# Patient Record
Sex: Female | Born: 1975 | Race: White | Hispanic: No | Marital: Single | Smoking: Former smoker
Health system: Southern US, Community
[De-identification: ages and names within clinical notes are randomized; demographics above are authoritative.]

---

## 2008-08-07 ENCOUNTER — Emergency Department (HOSPITAL_COMMUNITY): Admission: EM | Admit: 2008-08-07 | Discharge: 2008-08-07 | Payer: Self-pay | Admitting: Emergency Medicine

## 2009-08-28 ENCOUNTER — Emergency Department (HOSPITAL_COMMUNITY): Admission: EM | Admit: 2009-08-28 | Discharge: 2009-08-28 | Payer: Self-pay | Admitting: Emergency Medicine

## 2010-10-09 LAB — URINALYSIS, ROUTINE W REFLEX MICROSCOPIC
Bilirubin Urine: NEGATIVE
Ketones, ur: NEGATIVE mg/dL
Leukocytes, UA: NEGATIVE
Nitrite: NEGATIVE
Protein, ur: NEGATIVE mg/dL
Urobilinogen, UA: 0.2 mg/dL (ref 0.0–1.0)

## 2010-10-09 LAB — DIFFERENTIAL
Basophils Absolute: 0 10*3/uL (ref 0.0–0.1)
Basophils Relative: 0 % (ref 0–1)
Lymphocytes Relative: 38 % (ref 12–46)
Monocytes Relative: 4 % (ref 3–12)
Neutro Abs: 6.1 10*3/uL (ref 1.7–7.7)
Neutrophils Relative %: 55 % (ref 43–77)

## 2010-10-09 LAB — COMPREHENSIVE METABOLIC PANEL
Alkaline Phosphatase: 81 U/L (ref 39–117)
BUN: 10 mg/dL (ref 6–23)
Creatinine, Ser: 0.8 mg/dL (ref 0.4–1.2)
Glucose, Bld: 93 mg/dL (ref 70–99)
Potassium: 3.9 mEq/L (ref 3.5–5.1)
Total Protein: 7.5 g/dL (ref 6.0–8.3)

## 2010-10-09 LAB — CBC
HCT: 37.8 % (ref 36.0–46.0)
Hemoglobin: 12.8 g/dL (ref 12.0–15.0)
MCHC: 33.9 g/dL (ref 30.0–36.0)
MCV: 87.6 fL (ref 78.0–100.0)
Platelets: 302 10*3/uL (ref 150–400)
RDW: 14.3 % (ref 11.5–15.5)

## 2010-10-09 LAB — POCT PREGNANCY, URINE: Preg Test, Ur: NEGATIVE

## 2010-10-09 LAB — URINE MICROSCOPIC-ADD ON

## 2010-11-04 LAB — POCT I-STAT, CHEM 8
BUN: 3 mg/dL — ABNORMAL LOW (ref 6–23)
Chloride: 105 mEq/L (ref 96–112)
Sodium: 139 mEq/L (ref 135–145)

## 2010-11-04 LAB — DIFFERENTIAL
Basophils Absolute: 0 10*3/uL (ref 0.0–0.1)
Lymphocytes Relative: 12 % (ref 12–46)
Neutro Abs: 8.5 10*3/uL — ABNORMAL HIGH (ref 1.7–7.7)

## 2010-11-04 LAB — CBC
Hemoglobin: 12.6 g/dL (ref 12.0–15.0)
Platelets: 309 10*3/uL (ref 150–400)
RDW: 14.8 % (ref 11.5–15.5)
WBC: 10.2 10*3/uL (ref 4.0–10.5)

## 2010-11-04 LAB — RAPID STREP SCREEN (MED CTR MEBANE ONLY): Streptococcus, Group A Screen (Direct): NEGATIVE

## 2016-12-26 ENCOUNTER — Emergency Department (HOSPITAL_COMMUNITY): Payer: Self-pay

## 2016-12-26 ENCOUNTER — Emergency Department (HOSPITAL_COMMUNITY)
Admission: EM | Admit: 2016-12-26 | Discharge: 2016-12-26 | Disposition: A | Discharge status: Home / Self Care | Payer: Self-pay | Attending: Emergency Medicine | Admitting: Emergency Medicine

## 2016-12-26 ENCOUNTER — Encounter (HOSPITAL_COMMUNITY): Payer: Self-pay | Admitting: Emergency Medicine

## 2016-12-26 DIAGNOSIS — Y999 Unspecified external cause status: Secondary | ICD-10-CM | POA: Insufficient documentation

## 2016-12-26 DIAGNOSIS — W010XXA Fall on same level from slipping, tripping and stumbling without subsequent striking against object, initial encounter: Secondary | ICD-10-CM | POA: Insufficient documentation

## 2016-12-26 DIAGNOSIS — Y929 Unspecified place or not applicable: Secondary | ICD-10-CM | POA: Insufficient documentation

## 2016-12-26 DIAGNOSIS — S92354A Nondisplaced fracture of fifth metatarsal bone, right foot, initial encounter for closed fracture: Secondary | ICD-10-CM | POA: Insufficient documentation

## 2016-12-26 DIAGNOSIS — Y939 Activity, unspecified: Secondary | ICD-10-CM | POA: Insufficient documentation

## 2016-12-26 NOTE — ED Notes (Signed)
Pt taken to xray from triage

## 2016-12-26 NOTE — ED Notes (Signed)
[facility] PA in.

## 2016-12-26 NOTE — ED Triage Notes (Signed)
Pt here for right foot pain after trip and fall last night

## 2016-12-26 NOTE — ED Notes (Signed)
Called ortho tech for post op shoe. Size not in stock.

## 2016-12-26 NOTE — Progress Notes (Signed)
Orthopedic Tech Progress Note Patient Details:  Katherine Mcgee 12-14-1975 [id number]  Ortho Devices Type of Ortho Device: Postop shoe/boot Ortho Device/Splint Interventions: Application   [name] 12/26/2016, 12:11 PM

## 2016-12-26 NOTE — Discharge Instructions (Signed)
Please read and follow all provided instructions.  Your diagnoses today include:  1. Closed nondisplaced fracture of fifth metatarsal bone of right foot, initial encounter     Tests performed today include: Vital signs. See below for your results today.   Medications prescribed:  Take as prescribed   Home care instructions:  Follow any educational materials contained in this packet.  Follow-up instructions: Please follow-up with Podiatry for further evaluation of symptoms and treatment   Return instructions:  Please return to the Emergency Department if you do not get better, if you get worse, or new symptoms OR  - Fever (temperature greater than 101.82F)  - Bleeding that does not stop with holding pressure to the area    -Severe pain (please note that you may be more sore the day after your accident)  - Chest Pain  - Difficulty breathing  - Severe nausea or vomiting  - Inability to tolerate food and liquids  - Passing out  - Skin becoming red around your wounds  - Change in mental status (confusion or lethargy)  - New numbness or weakness    Please return if you have any other emergent concerns.  Additional Information:  Your vital signs today were: BP 129/89 (BP Location: Left Arm)    Pulse 100    Temp 97.7 F (36.5 C) (Oral)    Resp 18    Ht 5\' 3"  (1.6 m)    Wt 81.6 kg (180 lb)    SpO2 96%    BMI 31.89 kg/m  If your blood pressure (BP) was elevated above 135/85 this visit, please have this repeated by your doctor within one month. ---------------

## 2016-12-26 NOTE — ED Provider Notes (Signed)
[facility]-EMERGENCY DEPT Provider Note   CSN: [id number] Arrival date & time: 12/26/16  1102  By signing my name below, I, Rosana Fretana Waskiewicz, attest that this documentation has been prepared under the direction and in the presence of non-physician practitioner, Audry PiliMohr, Keveon Amsler, PA-C. Electronically Signed: Rosana Fretana Waskiewicz, ED Scribe. 12/26/16. 11:59 AM.  History   Chief Complaint Chief Complaint  Patient presents with  . Foot Pain   The history is provided by the patient. No language interpreter was used.   HPI Comments: Katherine Mcgee is an otherwise healthy 41 y.o. female who presents to the Emergency Department complaining of sudden onset, constant right foot pain onset last night. Pt states she tripped and fell, twisting her foot. Pt states pain is exacerbated by ambulation and direct pressure. Pt reports associated numbness and tingling. No treatments tried prior to arrival in the ED. Pt states she is allergic to Penicillin and has adverse reactions to Percocet. Pt denies right ankle pain or any other complaints at this time.  History reviewed. No pertinent past medical history.  There are no active problems to display for this patient.   History reviewed. No pertinent surgical history.  OB History    No data available       Home Medications    Prior to Admission medications   Not on File    Family History History reviewed. No pertinent family history.  Social History Social History  Substance Use Topics  . Smoking status: Never Smoker  . Smokeless tobacco: Never Used  . Alcohol use No     Allergies   Penicillins   Review of Systems Review of Systems  Musculoskeletal: Positive for arthralgias and myalgias.  Neurological: Positive for numbness.     Physical Exam Updated Vital Signs BP 129/89 (BP Location: Left Arm)   Pulse 100   Temp 97.7 F (36.5 C) (Oral)   Resp 18   Ht 5\' 3"  (1.6 m)   Wt 180 lb (81.6 kg)   SpO2 96%   BMI 31.89 kg/m   Physical  Exam  Constitutional: She is oriented to person, place, and time. She appears well-developed and well-nourished.  HENT:  Head: Normocephalic and atraumatic.  Cardiovascular: Normal rate.   Pulmonary/Chest: Effort normal.  Musculoskeletal: Normal range of motion. She exhibits tenderness. She exhibits no deformity.  TTP proximal 5th metatarsal. No obvious palpation or visible deformities. ROM intact. Cap refill < 2 seconds. NVI.   Neurological: She is alert and oriented to person, place, and time.  Skin: Skin is warm and dry.  Psychiatric: She has a normal mood and affect.  Nursing note and vitals reviewed.    ED Treatments / Results  DIAGNOSTIC STUDIES: Oxygen Saturation is 96% on RA, normal by my interpretation.   COORDINATION OF CARE: 11:55 AM-Discussed next steps with pt including wearing a post-op shoe, using crutches and following up with a Podiatrist. Pt verbalized understanding and is agreeable with the plan.   Labs (all labs ordered are listed, but only abnormal results are displayed) Labs Reviewed - No data to display  EKG  EKG Interpretation None       Radiology No results found.  Procedures Procedures (including critical care time)  Medications Ordered in ED Medications - No data to display   Initial Impression / Assessment and Plan / ED Course  I have reviewed the triage vital signs and the nursing notes.  Pertinent labs & imaging results that were available during my care of the patient were reviewed by me  and considered in my medical decision making (see chart for details).  Final Clinical Impressions(s) / ED Diagnoses   {I have reviewed and evaluated the relevant imaging studies.  {I have reviewed the relevant previous healthcare records.  {I obtained HPI from historian.   ED Course:  Assessment: Patient X-Ray shows well aligned transverse proximal 5th MTP fracture. Pt advised to follow up with podiatry. Patient given brace and crutches while in  ED, conservative therapy recommended and discussed. Patient will be discharged home & is agreeable with above plan. Returns precautions discussed. Pt appears safe for discharge.  Disposition/Plan:  DC Home Additional Verbal discharge instructions given and discussed with patient.  Pt Instructed to f/u with Podiatry in the next week for evaluation and treatment of symptoms. Return precautions given Pt acknowledges and agrees with plan  Supervising Physician Tilden Fossa, MD  Final diagnoses:  Closed nondisplaced fracture of fifth metatarsal bone of right foot, initial encounter    New Prescriptions New Prescriptions   No medications on file   I personally performed the services described in this documentation, which was scribed in my presence. The recorded information has been reviewed and is accurate.    Audry Pili, PA-C 12/26/16 1213    Tilden Fossa, MD 12/27/16 [phone]

## 2017-02-23 ENCOUNTER — Encounter (HOSPITAL_COMMUNITY): Payer: Self-pay | Admitting: Family Medicine

## 2017-02-23 ENCOUNTER — Emergency Department (HOSPITAL_COMMUNITY): Payer: Self-pay

## 2017-02-23 ENCOUNTER — Emergency Department (HOSPITAL_COMMUNITY)
Admission: EM | Admit: 2017-02-23 | Discharge: 2017-02-24 | Disposition: A | Discharge status: Home / Self Care | Payer: Self-pay | Attending: Emergency Medicine | Admitting: Emergency Medicine

## 2017-02-23 DIAGNOSIS — T50904A Poisoning by unspecified drugs, medicaments and biological substances, undetermined, initial encounter: Secondary | ICD-10-CM | POA: Insufficient documentation

## 2017-02-23 DIAGNOSIS — Y939 Activity, unspecified: Secondary | ICD-10-CM | POA: Insufficient documentation

## 2017-02-23 DIAGNOSIS — Y929 Unspecified place or not applicable: Secondary | ICD-10-CM | POA: Insufficient documentation

## 2017-02-23 DIAGNOSIS — Z87891 Personal history of nicotine dependence: Secondary | ICD-10-CM | POA: Insufficient documentation

## 2017-02-23 DIAGNOSIS — M79671 Pain in right foot: Secondary | ICD-10-CM | POA: Insufficient documentation

## 2017-02-23 DIAGNOSIS — W2209XA Striking against other stationary object, initial encounter: Secondary | ICD-10-CM | POA: Insufficient documentation

## 2017-02-23 DIAGNOSIS — Y999 Unspecified external cause status: Secondary | ICD-10-CM | POA: Insufficient documentation

## 2017-02-23 LAB — COMPREHENSIVE METABOLIC PANEL
ALT: 16 U/L (ref 14–54)
AST: 20 U/L (ref 15–41)
Albumin: 4.3 g/dL (ref 3.5–5.0)
Alkaline Phosphatase: 65 U/L (ref 38–126)
Anion gap: 10 (ref 5–15)
BUN: 19 mg/dL (ref 6–20)
CO2: 24 mmol/L (ref 22–32)
Calcium: 9.1 mg/dL (ref 8.9–10.3)
Chloride: 105 mmol/L (ref 101–111)
Creatinine, Ser: 1.09 mg/dL — ABNORMAL HIGH (ref 0.44–1.00)
GFR calc Af Amer: >60 mL/min (ref >60)
GFR calc non Af Amer: >60 mL/min (ref >60)
Glucose, Bld: 100 mg/dL — ABNORMAL HIGH (ref 65–99)
Potassium: 3 mmol/L — ABNORMAL LOW (ref 3.5–5.1)
Sodium: 139 mmol/L (ref 135–145)
Total Bilirubin: 0.8 mg/dL (ref 0.3–1.2)
Total Protein: 8.5 g/dL — ABNORMAL HIGH (ref 6.5–8.1)

## 2017-02-23 LAB — CBC WITH DIFFERENTIAL/PLATELET
Basophils Absolute: 0.1 10*3/uL (ref 0.0–0.1)
Basophils Relative: 0 %
Eosinophils Absolute: 0.1 10*3/uL (ref 0.0–0.7)
Eosinophils Relative: 1 %
HCT: 37.7 % (ref 36.0–46.0)
Hemoglobin: 12.8 g/dL (ref 12.0–15.0)
Lymphocytes Relative: 13 %
Lymphs Abs: 2.5 10*3/uL (ref 0.7–4.0)
MCH: 30.3 pg (ref 26.0–34.0)
MCHC: 34 g/dL (ref 30.0–36.0)
MCV: 89.3 fL (ref 78.0–100.0)
Monocytes Absolute: 1.1 10*3/uL — ABNORMAL HIGH (ref 0.1–1.0)
Monocytes Relative: 6 %
Neutro Abs: 15.9 10*3/uL — ABNORMAL HIGH (ref 1.7–7.7)
Neutrophils Relative %: 80 %
Platelets: 307 10*3/uL (ref 150–400)
RBC: 4.22 MIL/uL (ref 3.87–5.11)
RDW: 14.3 % (ref 11.5–15.5)
WBC: 19.6 10*3/uL — ABNORMAL HIGH (ref 4.0–10.5)

## 2017-02-23 LAB — SALICYLATE LEVEL: Salicylate Lvl: <7 mg/dL (ref 2.8–30.0)

## 2017-02-23 LAB — ACETAMINOPHEN LEVEL: Acetaminophen (Tylenol), Serum: <10 ug/mL — ABNORMAL LOW (ref 10–30)

## 2017-02-23 LAB — ETHANOL: Alcohol, Ethyl (B): <5 mg/dL (ref <5)

## 2017-02-23 NOTE — ED Notes (Signed)
Bed: [id number] Expected date:  Expected time:  Means of arrival:  Comments: 41 yr old overdose, sleeping pills
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
## 2017-02-24 NOTE — ED Notes (Signed)
Patient ride here per Registration. This RN walked with patient to lobby to meet her ride.

## 2017-02-24 NOTE — ED Notes (Signed)
Left her sister Katherine Mcgee and message to come get her [phone]

## 2017-02-24 NOTE — ED Notes (Addendum)
Pt.  Was discharged but waiting for her ride.This Nurse attempted to call families to pick her up . Pt. Was told to wait in her room while this Nurse trying to contact her family. Pt. Stated that she got up from bed so quick and walked outside her room to the Nurses station and felt light headedness and fell, witnessed by other staff , no LOC. Alert and oriented x3.Pt. Bit her lower lip and was bleeding controlled. According to staff who witnessed the fall, pt. was standing in the door and had a conversation with the Tech, asking for a cup of water,  Tech had a urinal on her hand so pt. Was told she will be right back  With her water , but pt. Walked straight to the Nurses station and fell. Witnessed stated she did not hit her head to the floor nor any surfaces. PA notified and was in pt.'s room to reevaluate pt. Pt. OK for discharge, waiting for sister,Beth to pick her up.Charge Nurse Victorino DikeJennifer made aware of the event.

## 2017-02-24 NOTE — Discharge Instructions (Signed)
Return here as needed.  Follow-up with your primary doctor. °

## 2017-02-24 NOTE — ED Notes (Signed)
Pt. Awaiting for sister to pick her up.
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
## 2017-02-24 NOTE — ED Notes (Signed)
Debbie, sec called the sister Waynetta SandyBeth and followed up the transport for pt., confirmed to come and pick up pt.

## 2017-02-24 NOTE — ED Notes (Signed)
Pt.'s sister beth called and was informed that pt. Is fos discharge and waiting to be picked up.

## 2018-11-28 IMAGING — CR DG FOOT COMPLETE 3+V*R*
3 series · 3 of 3 positions shown · non-contrast
Comparison: None.

CLINICAL DATA: Lateral foot pain after tripping on dog.

EXAM:
RIGHT FOOT COMPLETE - 3+ VIEW

[foot ap]
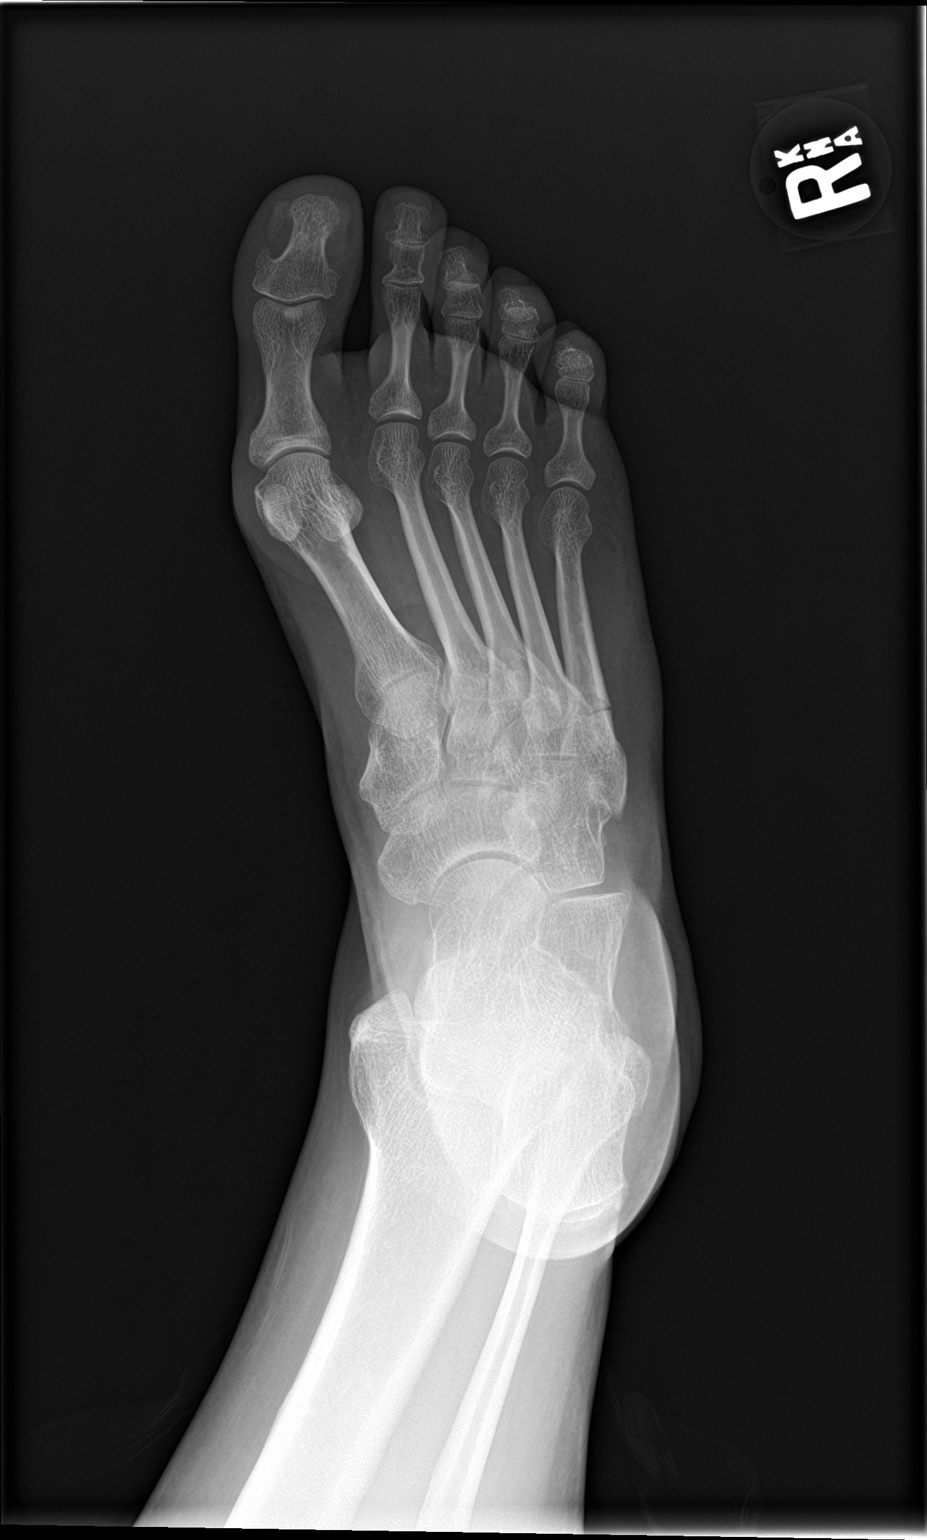

[foot obl]
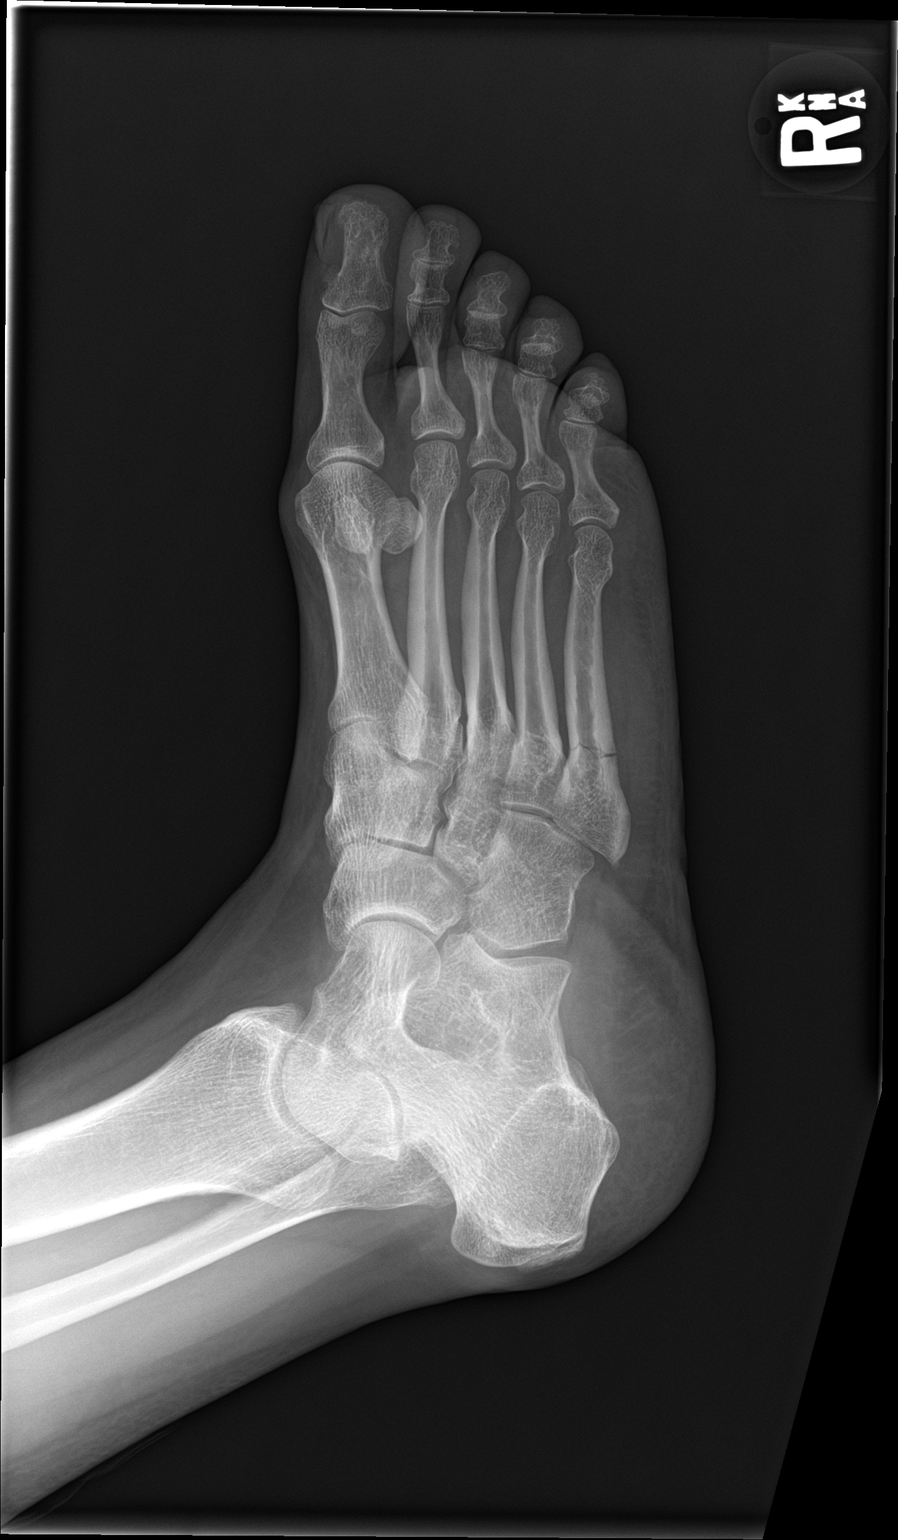

[foot lat]
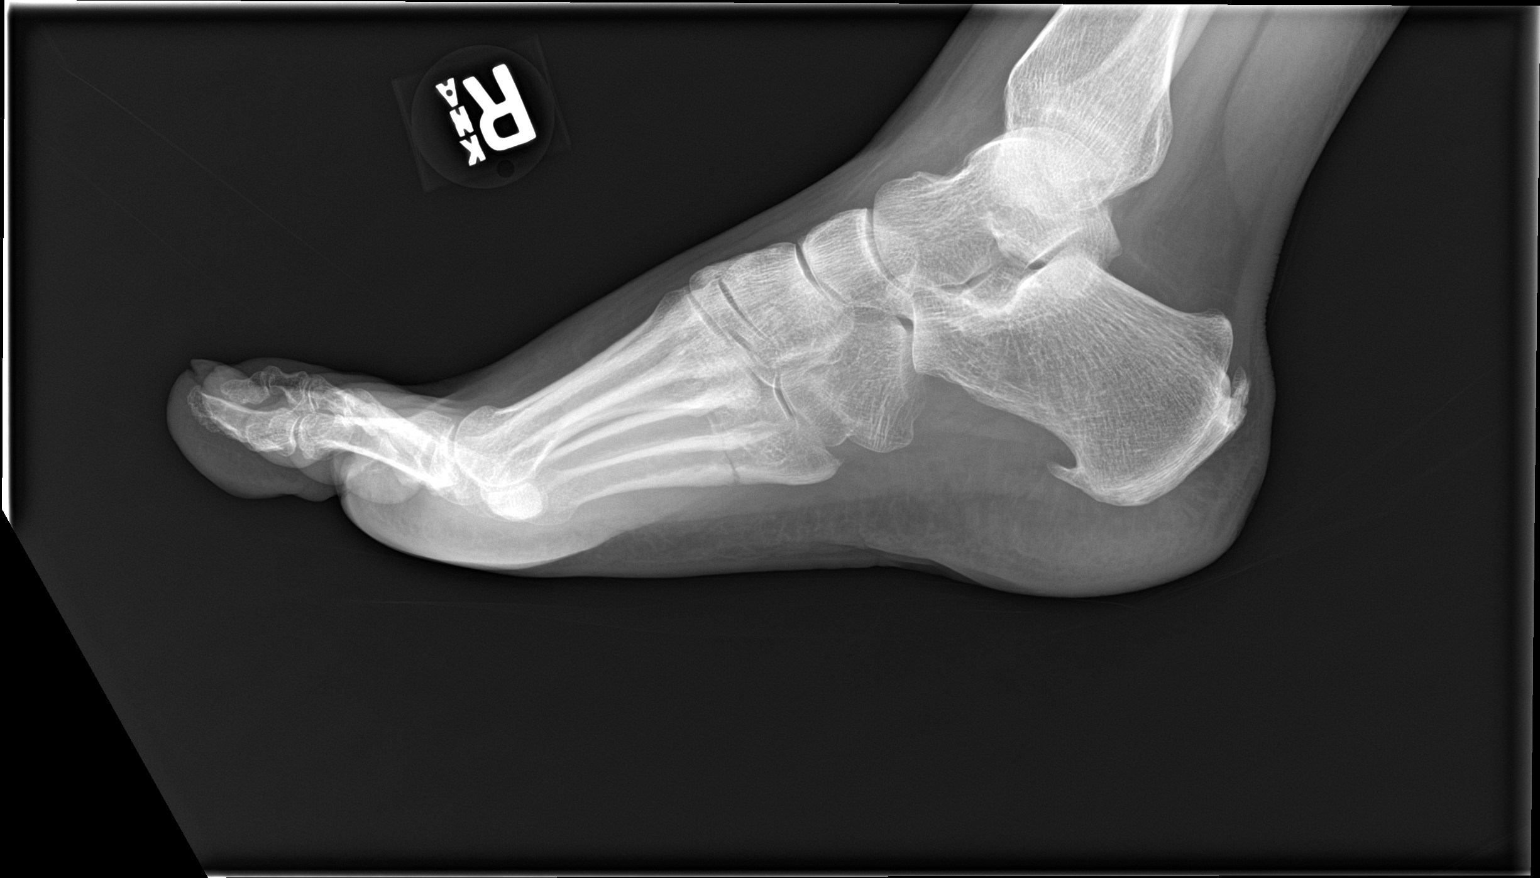

[3 of 3 positions shown; findings below may reference images not displayed]

FINDINGS: Transverse fracture identified through the proximal fifth
metatarsal. No substantial displacement or angulation at the
fracture site. No other acute bony abnormality evident.
IMPRESSION: Transverse fracture proximal fifth metatarsal.

## 2023-10-19 ENCOUNTER — Other Ambulatory Visit: Payer: Self-pay

## 2023-10-19 DIAGNOSIS — N132 Hydronephrosis with renal and ureteral calculous obstruction: Secondary | ICD-10-CM | POA: Insufficient documentation

## 2023-10-19 LAB — COMPREHENSIVE METABOLIC PANEL WITH GFR
ALT: 59 U/L — ABNORMAL HIGH (ref 0–44)
AST: 31 U/L (ref 15–41)
Albumin: 3.7 g/dL (ref 3.5–5.0)
Alkaline Phosphatase: 69 U/L (ref 38–126)
Anion gap: 7 (ref 5–15)
BUN: 16 mg/dL (ref 6–20)
CO2: 21 mmol/L — ABNORMAL LOW (ref 22–32)
Calcium: 8.7 mg/dL — ABNORMAL LOW (ref 8.9–10.3)
Chloride: 107 mmol/L (ref 98–111)
Creatinine, Ser: 0.85 mg/dL (ref 0.44–1.00)
GFR, Estimated: >60 mL/min (ref >60)
Glucose, Bld: 132 mg/dL — ABNORMAL HIGH (ref 70–99)
Potassium: 4 mmol/L (ref 3.5–5.1)
Sodium: 135 mmol/L (ref 135–145)
Total Bilirubin: 0.5 mg/dL (ref 0.0–1.2)
Total Protein: 7.6 g/dL (ref 6.5–8.1)

## 2023-10-19 LAB — CBC WITH DIFFERENTIAL/PLATELET
Abs Immature Granulocytes: 0.04 10*3/uL (ref 0.00–0.07)
Basophils Absolute: 0.1 10*3/uL (ref 0.0–0.1)
Basophils Relative: 1 %
Eosinophils Absolute: 0.2 10*3/uL (ref 0.0–0.5)
Eosinophils Relative: 1 %
HCT: 38.9 % (ref 36.0–46.0)
Hemoglobin: 12.9 g/dL (ref 12.0–15.0)
Immature Granulocytes: 0 %
Lymphocytes Relative: 31 %
Lymphs Abs: 4.3 10*3/uL — ABNORMAL HIGH (ref 0.7–4.0)
MCH: 30.3 pg (ref 26.0–34.0)
MCHC: 33.2 g/dL (ref 30.0–36.0)
MCV: 91.3 fL (ref 80.0–100.0)
Monocytes Absolute: 0.7 10*3/uL (ref 0.1–1.0)
Monocytes Relative: 5 %
Neutro Abs: 8.7 10*3/uL — ABNORMAL HIGH (ref 1.7–7.7)
Neutrophils Relative %: 62 %
Platelets: 298 10*3/uL (ref 150–400)
RBC: 4.26 MIL/uL (ref 3.87–5.11)
RDW: 13.6 % (ref 11.5–15.5)
WBC: 14 10*3/uL — ABNORMAL HIGH (ref 4.0–10.5)
nRBC: 0 % (ref 0.0–0.2)

## 2023-10-19 LAB — LIPASE, BLOOD: Lipase: 27 U/L (ref 11–51)

## 2023-10-19 NOTE — ED Triage Notes (Addendum)
 Pt arrives via POV with CC of LLQ abd pain after eating steak dinner tonight. Pt had bowel movement but pain is ongoing. Denies nausea/vomiting/diarrhea at this time. Hospitalized for lower GI infection in January.

## 2023-10-20 ENCOUNTER — Emergency Department
Admission: EM | Admit: 2023-10-20 | Discharge: 2023-10-20 | Disposition: A | Discharge status: Home / Self Care | Payer: Self-pay | Attending: Emergency Medicine | Admitting: Emergency Medicine

## 2023-10-20 ENCOUNTER — Emergency Department: Payer: Self-pay

## 2023-10-20 DIAGNOSIS — R310 Gross hematuria: Secondary | ICD-10-CM

## 2023-10-20 DIAGNOSIS — N200 Calculus of kidney: Secondary | ICD-10-CM

## 2023-10-20 LAB — URINALYSIS, ROUTINE W REFLEX MICROSCOPIC
Bilirubin Urine: NEGATIVE
Glucose, UA: NEGATIVE mg/dL
Ketones, ur: NEGATIVE mg/dL
Leukocytes,Ua: NEGATIVE
Nitrite: NEGATIVE
Protein, ur: NEGATIVE mg/dL
RBC / HPF: >50 RBC/hpf (ref 0–5)
Specific Gravity, Urine: 1.021 (ref 1.005–1.030)
pH: 7 (ref 5.0–8.0)

## 2023-10-20 MED ORDER — HYDROCODONE-ACETAMINOPHEN 5-325 MG PO TABS: 1.0000 | ORAL_TABLET | Freq: Four times a day (QID) | ORAL | 0 refills | Status: AC | PRN

## 2023-10-20 MED ORDER — IBUPROFEN 800 MG PO TABS: 800.0000 mg | ORAL_TABLET | Freq: Three times a day (TID) | ORAL | 0 refills | Status: AC | PRN

## 2023-10-20 MED ORDER — IBUPROFEN 800 MG PO TABS
800.0000 mg | ORAL_TABLET | Freq: Once | ORAL | Status: AC
  Administered 2023-10-20: 800 mg via ORAL
  Filled 2023-10-20: qty 1

## 2023-10-20 MED ORDER — ONDANSETRON 4 MG PO TBDP: 4.0000 mg | ORAL_TABLET | Freq: Four times a day (QID) | ORAL | 0 refills | Status: AC | PRN

## 2023-10-20 MED ORDER — TAMSULOSIN HCL 0.4 MG PO CAPS: 0.4000 mg | ORAL_CAPSULE | Freq: Every day | ORAL | 0 refills | Status: AC

## 2023-10-20 NOTE — Discharge Instructions (Addendum)

## 2023-10-20 NOTE — ED Provider Notes (Signed)
 [facility] Provider Note    Event Date/Time   First MD Initiated Contact with Patient 10/20/23 [phone]     (approximate)   History   Abdominal Pain   HPI  Katherine Mcgee is a 48 y.o. female with no significant past medical history who presents to the emergency department with left-sided flank and abdominal pain that is crampy in nature, intermittent with nausea, gross hematuria.  No dysuria, diarrhea, bloody stools or melena.  No prior history of kidney stone.  Denies previous abdominal surgery.  States she was recently treated for "an infection" in the left lower part of her abdomen.  It appears she was admitted to the hospital in February 2025 for colitis.   History provided by patient, family.    History reviewed. No pertinent past medical history.  History reviewed. No pertinent surgical history.  MEDICATIONS:  Prior to Admission medications   Not on File    Physical Exam   Triage Vital Signs: ED Triage Vitals  Encounter Vitals Group     BP 10/19/23 2257 (!) 172/83     Systolic BP Percentile --      Diastolic BP Percentile --      Pulse Rate 10/19/23 2257 62     Resp 10/19/23 2252 (!) 21     Temp 10/19/23 2257 98.2 F (36.8 C)     Temp Source 10/19/23 2257 Oral     SpO2 10/19/23 2257 100 %     Weight 10/19/23 2252 220 lb (99.8 kg)     Height 10/19/23 2252 5\' 3"  (1.6 m)     Head Circumference --      Peak Flow --      Pain Score 10/19/23 2252 10     Pain Loc --      Pain Education --      Exclude from Growth Chart --     Most recent vital signs: Vitals:   10/19/23 2257 10/20/23 0538  BP: (!) 172/83   Pulse: 62   Resp:  15  Temp: 98.2 F (36.8 C) 98.2 F (36.8 C)  SpO2: 100%     CONSTITUTIONAL: Alert, responds appropriately to questions. Well-appearing; well-nourished HEAD: Normocephalic, atraumatic EYES: Conjunctivae clear, pupils appear equal, sclera nonicteric ENT: normal nose; moist mucous membranes NECK: Supple,  normal ROM CARD: RRR; S1 and S2 appreciated RESP: Normal chest excursion without splinting or tachypnea; breath sounds clear and equal bilaterally; no wheezes, no rhonchi, no rales, no hypoxia or respiratory distress, speaking full sentences ABD/GI: Non-distended; soft, non-tender, no rebound, no guarding, no peritoneal signs BACK: The back appears normal EXT: Normal ROM in all joints; no deformity noted, no edema SKIN: Normal color for age and race; warm; no rash on exposed skin NEURO: Moves all extremities equally, normal speech PSYCH: The patient's mood and manner are appropriate.   ED Results / Procedures / Treatments   LABS: (all labs ordered are listed, but only abnormal results are displayed) Labs Reviewed  CBC WITH DIFFERENTIAL/PLATELET - Abnormal; Notable for the following components:      Result Value   WBC 14.0 (*)    Neutro Abs 8.7 (*)    Lymphs Abs 4.3 (*)    All other components within normal limits  COMPREHENSIVE METABOLIC PANEL WITH GFR - Abnormal; Notable for the following components:   CO2 21 (*)    Glucose, Bld 132 (*)    Calcium 8.7 (*)    ALT 59 (*)    All other components  within normal limits  URINALYSIS, ROUTINE W REFLEX MICROSCOPIC - Abnormal; Notable for the following components:   Color, Urine STRAW (*)    APPearance HAZY (*)    Hgb urine dipstick LARGE (*)    Bacteria, UA RARE (*)    All other components within normal limits  URINE CULTURE  LIPASE, BLOOD     EKG:     RADIOLOGY: My personal review and interpretation of imaging: CT scan shows left ureterolithiasis.  I have personally reviewed all radiology reports.   CT Renal Stone Study Result Date: 10/20/2023 CLINICAL DATA:  49 year old female with abdominal pain, postprandial left lower quadrant pain. History of previous lower GI infection. EXAM: CT ABDOMEN AND PELVIS WITHOUT CONTRAST TECHNIQUE: Multidetector CT imaging of the abdomen and pelvis was performed following the standard protocol  without IV contrast. RADIATION DOSE REDUCTION: This exam was performed according to the departmental dose-optimization program which includes automated exposure control, adjustment of the mA and/or kV according to patient size and/or use of iterative reconstruction technique. COMPARISON:  None Available. FINDINGS: Lower chest: Negative. Hepatobiliary: Cholecystectomy.  Negative noncontrast liver. Pancreas: Negative. Spleen: Negative. Adrenals/Urinary Tract: Normal adrenal glands. Punctate right nephrolithiasis. No right hydronephrosis. Right ureter is within normal limits. Left nephromegaly, hydronephrosis, hydroureter, perinephric edema/stranding (coronal image 68). Punctate left intrarenal calculi. Periureteral stranding and hydroureter continue into the pelvis, and there is an oval obstructing calculus at the left ureterovesical junction measuring 4 mm. Otherwise unremarkable urinary bladder. Stomach/Bowel: Decompressed, indistinct appearance of the rectosigmoid colon (series 2, image 69). Descending colon has a more normal appearance. Retained gas and stool in the transverse and right colon which otherwise appear negative. Normal appendix on coronal image 58. Decompressed terminal ileum and no dilated small bowel. Small fat containing umbilical hernia. No herniated bowel. Noncontrast stomach and duodenum are decompressed and negative. No free air or free fluid. Vascular/Lymphatic: Normal caliber abdominal aorta. No calcified atherosclerosis or lymphadenopathy identified. Reproductive: Surgically absent uterus. Noncontrast ovaries are within normal limits. Other: No pelvis free fluid. 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 Mild degenerative retrolisthesis of L4 on L5. No acute osseous abnormality identified. IMPRESSION: 1. Acute obstructive uropathy on the Left due to 4 mm calculus at the left UVJ. Possible forniceal rupture. Punctate additional bilateral nephrolithiasis.  2. No other acute or inflammatory process identified in the noncontrast abdomen or pelvis; Indistinct appearance of the rectosigmoid colon is probably artifact from under distension. Chronic L5 pars fractures, lower lumbar spine degeneration. Electronically Signed   By: Odessa Fleming M.D.   On: 10/20/2023 06:14     PROCEDURES:  Critical Care performed: No      Procedures    IMPRESSION / MDM / ASSESSMENT AND PLAN / ED COURSE  I reviewed the triage vital signs and the nursing notes.    Patient here for left-sided abdominal pain, gross hematuria.     DIFFERENTIAL DIAGNOSIS (includes but not limited to):   Kidney stone, UTI, pyelonephritis, diverticulitis, colitis, bowel obstruction   Patient's presentation is most consistent with acute presentation with potential threat to life or bodily function.   PLAN: Labs obtained from triage show a leukocytosis of 14,000.  No fever.  Normal creatinine, LFTs and lipase.  Urine does show gross blood but no other sign of infection.  Will add on urine culture.  Will obtain CT renal study.  She states her pain is markedly improved and denies anything for pain or nausea at this time.   MEDICATIONS GIVEN IN ED: Medications  ibuprofen (ADVIL) tablet 800 mg (800 mg Oral Given 10/20/23 0645)     ED COURSE: CT scan reviewed and interpreted by myself and the radiologist and shows a 4 mm stone in the left UVJ with possible forniceal rupture.  No other acute abnormality seen.  Patient still comfortable.  Complaining of some soreness to her abdomen.  Agrees to ibuprofen.  I feel she is safe for discharge.  Will discharge with pain and nausea medicine, Flomax and urology follow-up.   At this time, I do not feel there is any life-threatening condition present. I reviewed all nursing notes, vitals, pertinent previous records.  All lab and urine results, EKGs, imaging ordered have been independently reviewed and interpreted by myself.  I reviewed all available  radiology reports from any imaging ordered this visit.  Based on my assessment, I feel the patient is safe to be discharged home without further emergent workup and can continue workup as an outpatient as needed. Discussed all findings, treatment plan as well as usual and customary return precautions.  They verbalize understanding and are comfortable with this plan.  Outpatient follow-up has been provided as needed.  All questions have been answered.   CONSULTS:  none   OUTSIDE RECORDS REVIEWED: Reviewed admission in February 2025.       FINAL CLINICAL IMPRESSION(S) / ED DIAGNOSES   Final diagnoses:  Gross hematuria  Kidney stone     Rx / DC Orders   ED Discharge Orders          Ordered    ibuprofen (ADVIL) 800 MG tablet  Every 8 hours PRN        10/20/23 0639    HYDROcodone-acetaminophen (NORCO/VICODIN) 5-325 MG tablet  Every 6 hours PRN        10/20/23 0639    ondansetron (ZOFRAN-ODT) 4 MG disintegrating tablet  Every 6 hours PRN        10/20/23 0639    tamsulosin (FLOMAX) 0.4 MG CAPS capsule  Daily        10/20/23 0454             Note:  This document was prepared using Dragon voice recognition software and may include unintentional dictation errors.   Johann Gascoigne, Layla Maw, DO 10/20/23 [phone]

## 2023-10-21 LAB — URINE CULTURE
